# Patient Record
Sex: Female | Born: 1995 | Hispanic: No | Marital: Single | Smoking: Current every day smoker
Health system: Southern US, Community
[De-identification: ages and names within clinical notes are randomized; demographics above are authoritative.]

## PROBLEM LIST (undated history)

## (undated) DIAGNOSIS — J45909 Unspecified asthma, uncomplicated: Secondary | ICD-10-CM

## (undated) HISTORY — PX: CYSTECTOMY: SUR359

## (undated) HISTORY — PX: WISDOM TOOTH EXTRACTION: SHX21

---
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
## 2013-02-15 ENCOUNTER — Encounter (HOSPITAL_COMMUNITY): Payer: Self-pay | Admitting: Emergency Medicine

## 2013-02-15 ENCOUNTER — Emergency Department (HOSPITAL_COMMUNITY)
Admission: EM | Admit: 2013-02-15 | Discharge: 2013-02-15 | Disposition: A | Discharge status: Home / Self Care | Payer: Medicaid Other | Attending: Emergency Medicine | Admitting: Emergency Medicine

## 2013-02-15 DIAGNOSIS — B0089 Other herpesviral infection: Secondary | ICD-10-CM

## 2013-02-15 DIAGNOSIS — Z79899 Other long term (current) drug therapy: Secondary | ICD-10-CM | POA: Insufficient documentation

## 2013-02-15 DIAGNOSIS — B009 Herpesviral infection, unspecified: Secondary | ICD-10-CM | POA: Insufficient documentation

## 2013-02-15 DIAGNOSIS — R599 Enlarged lymph nodes, unspecified: Secondary | ICD-10-CM | POA: Insufficient documentation

## 2013-02-15 DIAGNOSIS — F17200 Nicotine dependence, unspecified, uncomplicated: Secondary | ICD-10-CM | POA: Insufficient documentation

## 2013-02-15 DIAGNOSIS — J45909 Unspecified asthma, uncomplicated: Secondary | ICD-10-CM | POA: Insufficient documentation

## 2013-02-15 MED ORDER — HYDROCODONE-ACETAMINOPHEN 5-325 MG PO TABS: 1.0000 | ORAL_TABLET | Freq: Four times a day (QID) | ORAL | Status: DC | PRN

## 2013-02-15 MED ORDER — VALACYCLOVIR HCL 1 G PO TABS: 1000.0000 mg | ORAL_TABLET | Freq: Three times a day (TID) | ORAL | Status: DC

## 2013-02-15 NOTE — ED Provider Notes (Signed)
This chart was scribed for Arthor Captain PA-C, a non-physician practitioner working with Gilda Crease, * by Lewanda Rife, ED Scribe. This patient was seen in room WTR9/WTR9 and the patient's care was started at 1600.    CSN: [id number]     Arrival date & time 02/15/13  1432 History   First MD Initiated Contact with Patient 02/15/13 1549     Chief Complaint  Patient presents with  . Rash   (Consider location/radiation/quality/duration/timing/severity/associated sxs/prior Treatment) The history is provided by the patient.   HPI Comments: Sonya Walters is a 17 y.o. female who presents to the Emergency Department complaining of constant worsening pruritic rash she attributed tto abrasion from shaving on right groin onset over a week. Reports "nick from shaving" and states rash appeared soon after pt wore  "someone else's harness" which is a prosthetic penis used for intercourse . Reports rash is moderately painful. Reports associated right inguinal tender" lymph node". Reports pain is exacerbated by touch and alleviated by nothing. Denies associated unprotected sex, vaginal discharge, fever, and urinary symptoms. Reports she was prescribed Keflex for rash from a hospital in [place] with no relief of symptoms.   Past Medical History  Diagnosis Date  . Asthma    Past Surgical History  Procedure Laterality Date  . Cystectomy      over right eye  . Wisdom tooth extraction     History reviewed. No pertinent family history. History  Substance Use Topics  . Smoking status: Current Every Day Smoker    Types: Cigarettes  . Smokeless tobacco: Never Used  . Alcohol Use: Yes     Comment: on occasion    Review of Systems  Constitutional: Negative for fever.  Skin: Positive for rash.  Hematological: Positive for adenopathy.  Psychiatric/Behavioral: Negative for confusion.    Allergies  Review of patient's allergies indicates no known allergies.  Home Medications   Current  Outpatient Rx  Name  Route  Sig  Dispense  Refill  . albuterol (PROVENTIL HFA;VENTOLIN HFA) 108 (90 BASE) MCG/ACT inhaler   Inhalation   Inhale 2 puffs into the lungs every 6 (six) hours as needed for wheezing or shortness of breath.          . beclomethasone (QVAR) 40 MCG/ACT inhaler   Inhalation   Inhale 2 puffs into the lungs 2 (two) times daily as needed (for shortness of breath).          Marland Kitchen BIOTIN PO   Oral   Take 1 capsule by mouth daily.         . cephALEXin (KEFLEX) 500 MG capsule   Oral   Take 500 mg by mouth 4 (four) times daily. 10 day course of therapy started 02/08/13.         Marland Kitchen testosterone cypionate (DEPOTESTOTERONE CYPIONATE) 100 MG/ML injection   Intramuscular   Inject 100 mg into the muscle every 14 (fourteen) days. For IM use only          BP 113/66  Pulse 101  Temp(Src) 98.2 F (36.8 C) (Oral)  Resp 18  SpO2 100% Physical Exam  Nursing note and vitals reviewed. Constitutional: He is oriented to person, place, and time. He appears well-developed and well-nourished. No distress.  HENT:  Head: Normocephalic and atraumatic.  Eyes: EOM are normal.  Neck: Neck supple. No tracheal deviation present.  Cardiovascular: Normal rate.   Pulmonary/Chest: Effort normal. No respiratory distress.  Musculoskeletal: Normal range of motion.  Lymphadenopathy:  Right: Inguinal (tender) adenopathy present.  Neurological: He is alert and oriented to person, place, and time.  Skin: Skin is warm and dry. Rash noted. Rash is vesicular.  Tender erythematous, non-draining, vesicular rash on right inguinal region, no secondary infection noted    Psychiatric: He has a normal mood and affect. His behavior is normal.    ED Course  Procedures (including critical care time) Medications - No data to display  Labs Review Labs Reviewed - No data to display Imaging Review No results found.  MDM   1. Herpetic dermatitis    Patient with herpetic eruption of the  thigh. Will d/c with valtrex and pain medication. Nos signs of secondary infection   I personally performed the services described in this documentation, which was scribed in my presence. The recorded information has been reviewed and is accurate.    Arthor Captain, PA-C 02/20/13 2016

## 2013-02-15 NOTE — ED Notes (Signed)
pT presents to the ED with a complaint of a rash that stems from an abrasion.  Pt was seen last week for said and given antibiotics but the rash has persisted and grown.  Rash is red and measures 6cm x 6 cm located on the pt's right anterior hip

## 2013-02-22 NOTE — ED Provider Notes (Signed)
Medical screening examination/treatment/procedure(s) were performed by non-physician practitioner and as supervising physician I was immediately available for consultation/collaboration.    Gilda Crease, MD 02/22/13 1310

## 2013-07-04 ENCOUNTER — Emergency Department (HOSPITAL_COMMUNITY): Payer: Medicaid Other

## 2013-07-04 ENCOUNTER — Emergency Department (HOSPITAL_COMMUNITY)
Admission: EM | Admit: 2013-07-04 | Discharge: 2013-07-04 | Disposition: A | Discharge status: Home / Self Care | Payer: Medicaid Other | Attending: Emergency Medicine | Admitting: Emergency Medicine

## 2013-07-04 DIAGNOSIS — F17200 Nicotine dependence, unspecified, uncomplicated: Secondary | ICD-10-CM | POA: Insufficient documentation

## 2013-07-04 DIAGNOSIS — R197 Diarrhea, unspecified: Secondary | ICD-10-CM

## 2013-07-04 DIAGNOSIS — R42 Dizziness and giddiness: Secondary | ICD-10-CM | POA: Insufficient documentation

## 2013-07-04 DIAGNOSIS — R000 Tachycardia, unspecified: Secondary | ICD-10-CM | POA: Insufficient documentation

## 2013-07-04 DIAGNOSIS — J45909 Unspecified asthma, uncomplicated: Secondary | ICD-10-CM | POA: Insufficient documentation

## 2013-07-04 DIAGNOSIS — Z3202 Encounter for pregnancy test, result negative: Secondary | ICD-10-CM | POA: Insufficient documentation

## 2013-07-04 DIAGNOSIS — R040 Epistaxis: Secondary | ICD-10-CM | POA: Insufficient documentation

## 2013-07-04 DIAGNOSIS — E860 Dehydration: Secondary | ICD-10-CM | POA: Insufficient documentation

## 2013-07-04 DIAGNOSIS — R52 Pain, unspecified: Secondary | ICD-10-CM | POA: Insufficient documentation

## 2013-07-04 DIAGNOSIS — R5381 Other malaise: Secondary | ICD-10-CM | POA: Insufficient documentation

## 2013-07-04 DIAGNOSIS — R61 Generalized hyperhidrosis: Secondary | ICD-10-CM | POA: Insufficient documentation

## 2013-07-04 DIAGNOSIS — R5383 Other fatigue: Secondary | ICD-10-CM

## 2013-07-04 LAB — COMPREHENSIVE METABOLIC PANEL
ALBUMIN: 4.4 g/dL (ref 3.5–5.2)
ALT: 17 U/L (ref 0–35)
AST: 29 U/L (ref 0–37)
Alkaline Phosphatase: 52 U/L (ref 39–117)
BILIRUBIN TOTAL: 0.6 mg/dL (ref 0.3–1.2)
BUN: 23 mg/dL (ref 6–23)
CALCIUM: 9.6 mg/dL (ref 8.4–10.5)
CHLORIDE: 97 meq/L (ref 96–112)
CO2: 23 mEq/L (ref 19–32)
CREATININE: 1.05 mg/dL (ref 0.50–1.10)
GFR calc Af Amer: 88 mL/min — ABNORMAL LOW (ref >90)
GFR calc non Af Amer: 76 mL/min — ABNORMAL LOW (ref >90)
Glucose, Bld: 95 mg/dL (ref 70–99)
Potassium: 3.7 mEq/L (ref 3.7–5.3)
Sodium: 134 mEq/L — ABNORMAL LOW (ref 137–147)
TOTAL PROTEIN: 10.1 g/dL — AB (ref 6.0–8.3)

## 2013-07-04 LAB — URINALYSIS, ROUTINE W REFLEX MICROSCOPIC
Bilirubin Urine: NEGATIVE
Glucose, UA: NEGATIVE mg/dL
KETONES UR: NEGATIVE mg/dL
LEUKOCYTES UA: NEGATIVE
NITRITE: NEGATIVE
PH: 6 (ref 5.0–8.0)
PROTEIN: 100 mg/dL — AB
SPECIFIC GRAVITY, URINE: 1.036 — AB (ref 1.005–1.030)
UROBILINOGEN UA: 0.2 mg/dL (ref 0.0–1.0)

## 2013-07-04 LAB — CBC
HEMATOCRIT: 38.8 % (ref 36.0–46.0)
HEMOGLOBIN: 13.9 g/dL (ref 12.0–15.0)
MCH: 32.5 pg (ref 26.0–34.0)
MCHC: 35.8 g/dL (ref 30.0–36.0)
MCV: 90.7 fL (ref 78.0–100.0)
Platelets: 253 10*3/uL (ref 150–400)
RBC: 4.28 MIL/uL (ref 3.87–5.11)
RDW: 11.6 % (ref 11.5–15.5)
WBC: 4.2 10*3/uL (ref 4.0–10.5)

## 2013-07-04 LAB — URINE MICROSCOPIC-ADD ON

## 2013-07-04 LAB — PREGNANCY, URINE: Preg Test, Ur: NEGATIVE

## 2013-07-04 LAB — LIPASE, BLOOD: LIPASE: 31 U/L (ref 11–59)

## 2013-07-04 MED ORDER — KETOROLAC TROMETHAMINE 30 MG/ML IJ SOLN
30.0000 mg | Freq: Once | INTRAMUSCULAR | Status: AC
  Administered 2013-07-04: 30 mg via INTRAVENOUS
  Filled 2013-07-04: qty 1

## 2013-07-04 MED ORDER — SODIUM CHLORIDE 0.9 % IV BOLUS (SEPSIS)
1000.0000 mL | Freq: Once | INTRAVENOUS | Status: AC
  Administered 2013-07-04: 1000 mL via INTRAVENOUS

## 2013-07-04 MED ORDER — PROMETHAZINE HCL 25 MG RE SUPP: 25.0000 mg | Freq: Four times a day (QID) | RECTAL | Status: DC | PRN

## 2013-07-04 MED ORDER — ONDANSETRON 4 MG PO TBDP: 4.0000 mg | ORAL_TABLET | Freq: Three times a day (TID) | ORAL | Status: DC | PRN

## 2013-07-04 MED ORDER — OXYMETAZOLINE HCL 0.05 % NA SOLN
1.0000 | Freq: Once | NASAL | Status: AC
  Administered 2013-07-04: 1 via NASAL
  Filled 2013-07-04: qty 15

## 2013-07-04 MED ORDER — SODIUM CHLORIDE 0.9 % IV BOLUS (SEPSIS)
500.0000 mL | Freq: Once | INTRAVENOUS | Status: AC
  Administered 2013-07-04: 500 mL via INTRAVENOUS

## 2013-07-04 MED ORDER — ONDANSETRON HCL 4 MG/2ML IJ SOLN
4.0000 mg | Freq: Once | INTRAMUSCULAR | Status: AC
  Administered 2013-07-04: 4 mg via INTRAVENOUS
  Filled 2013-07-04: qty 2

## 2013-07-04 NOTE — ED Provider Notes (Signed)
CSN: [id number]     Arrival date & time 07/04/13  1441 History   First MD Initiated Contact with Patient 07/04/13 1612     Chief Complaint  Patient presents with  . Chills  . Generalized Body Aches  . Diarrhea   (Consider location/radiation/quality/duration/timing/severity/associated sxs/prior Treatment) HPI Comments: Sonya Walters is a 18 year-old female with a past medical history of asthma and undergoing testosterone therapy, presenting the Emergency Department with a chief complaint of subjective fever for 2 days, and diarrhea.  The patient reports 10-11 episodes of non-bloody diarrhea. Denies abdominal pain, nausea or vomiting. The patient reports a productive cough that worsened yesterday. The patient also complains of lightheadedness with changing positions from laying to standing, denies syncope. She also complains of 2 episodes of epistaxis today. Patient's last menstrual period was 07/04/2013.    The history is provided by the patient and medical records. No language interpreter was used.    Past Medical History  Diagnosis Date  . Asthma    Past Surgical History  Procedure Laterality Date  . Cystectomy      over right eye  . Wisdom tooth extraction     No family history on file. History  Substance Use Topics  . Smoking status: Current Every Day Smoker    Types: Cigarettes  . Smokeless tobacco: Never Used  . Alcohol Use: Yes     Comment: on occasion   OB History   Grav Para Term Preterm Abortions TAB SAB Ect Mult Living                 Review of Systems  Constitutional: Positive for chills and fatigue.  HENT: Positive for nosebleeds and rhinorrhea.   Respiratory: Positive for cough.   Genitourinary: Negative for dysuria, urgency, frequency and hematuria.  Neurological: Positive for light-headedness. Negative for syncope and headaches.    Allergies  Review of patient's allergies indicates no known allergies.  Home Medications   Current Outpatient Rx  Name   Route  Sig  Dispense  Refill  . testosterone cypionate (DEPOTESTOTERONE CYPIONATE) 100 MG/ML injection   Intramuscular   Inject 100 mg into the muscle every 14 (fourteen) days. For IM use only         . ondansetron (ZOFRAN ODT) 4 MG disintegrating tablet   Oral   Take 1 tablet (4 mg total) by mouth every 8 (eight) hours as needed for nausea or vomiting.   10 tablet   0   . promethazine (PHENERGAN) 25 MG suppository   Rectal   Place 1 suppository (25 mg total) rectally every 6 (six) hours as needed for nausea or vomiting.   12 each   0    BP 108/69  Pulse 94  Temp(Src) 98.4 F (36.9 C) (Oral)  Resp 18  SpO2 99%  LMP 07/04/2013 Physical Exam  Nursing note and vitals reviewed. Constitutional: She is oriented to person, place, and time. She appears well-developed and well-nourished.  HENT:  Head: Normocephalic and atraumatic.  Nose: Epistaxis is observed.  Left anterior septal lesion. Bleeding controled in ED.   Eyes: EOM are normal. Pupils are equal, round, and reactive to light. No scleral icterus.  Neck: Normal range of motion. Neck supple.  Cardiovascular: Regular rhythm.  Tachycardia present.   Pulmonary/Chest: Effort normal. No respiratory distress. She has no wheezes. She has no rhonchi. She has no rales.  Abdominal: Soft. Bowel sounds are normal. She exhibits no distension. There is tenderness. There is no rebound, no guarding, no  CVA tenderness, no tenderness at McBurney's point and negative Murphy's sign.    Neurological: She is alert and oriented to person, place, and time.  Skin: Skin is warm. She is diaphoretic.    ED Course  Procedures (including critical care time) Labs Review Labs Reviewed  URINALYSIS, ROUTINE W REFLEX MICROSCOPIC - Abnormal; Notable for the following:    Color, Urine AMBER (*)    Specific Gravity, Urine 1.036 (*)    Hgb urine dipstick SMALL (*)    Protein, ur 100 (*)    All other components within normal limits  COMPREHENSIVE  METABOLIC PANEL - Abnormal; Notable for the following:    Sodium 134 (*)    Total Protein 10.1 (*)    GFR calc non Af Amer 76 (*)    GFR calc Af Amer 88 (*)    All other components within normal limits  URINE MICROSCOPIC-ADD ON - Abnormal; Notable for the following:    Bacteria, UA FEW (*)    Casts GRANULAR CAST (*)    All other components within normal limits  URINE CULTURE  CBC  PREGNANCY, URINE  LIPASE, BLOOD   Imaging Review Dg Chest 2 View  07/04/2013   CLINICAL DATA:  Cough, chills and body aches for 2-3 days.  EXAM: CHEST  2 VIEW  COMPARISON:  None.  FINDINGS: Heart size and mediastinal contours are within normal limits. Both lungs are clear. Visualized skeletal structures are unremarkable.  IMPRESSION: Negative exam.   Electronically Signed   By: Drusilla Kanner M.D.   On: 07/04/2013 17:25    EKG Interpretation   None       MDM   1. Diarrhea   2. Dehydration    Pt with diarrhea and generalized body aches, likely viral. Also complains of productive cough with fever will rule out pneumonia.  XR-without acute process. 1806: Re-eval patient reports body aches and nausea have improved, Epistaxis has resolved. Denies episode of dirrhea.  Abdomen non-tender to palpation.  UA-shows high specific gravity and hbg-likely due to menses, granular casts due to dehydration. Re-eval: pt reports symptom improvement.  Abdomen non-tender to palpation.  Discussed lab results, imaging results, and treatment plan with the patient and the patient's mother. Return precautions given. Reports understanding and no other concerns at this time.  Patient is stable for discharge at this time.   Meds given in ED:  Medications  sodium chloride 0.9 % bolus 1,000 mL (0 mLs Intravenous Stopped 07/04/13 1813)  ondansetron (ZOFRAN) injection 4 mg (4 mg Intravenous Given 07/04/13 1733)  oxymetazoline (AFRIN) 0.05 % nasal spray 1 spray (1 spray Each Nare Given 07/04/13 1736)  ketorolac (TORADOL) 30 MG/ML  injection 30 mg (30 mg Intravenous Given 07/04/13 1732)  sodium chloride 0.9 % bolus 500 mL (0 mLs Intravenous Stopped 07/04/13 1857)    Discharge Medication List as of 07/04/2013  7:09 PM    START taking these medications   Details  ondansetron (ZOFRAN ODT) 4 MG disintegrating tablet Take 1 tablet (4 mg total) by mouth every 8 (eight) hours as needed for nausea or vomiting., Starting 07/04/2013, Until Discontinued, Print    promethazine (PHENERGAN) 25 MG suppository Place 1 suppository (25 mg total) rectally every 6 (six) hours as needed for nausea or vomiting., Starting 07/04/2013, Until Discontinued, Print          Clabe Seal, PA-C 07/05/13 2043

## 2013-07-04 NOTE — ED Notes (Signed)
Pt is a [employer] and for the past 3 days has had nausea/ dirrhea, body ache, not feeling well. Has not taken anything for it.

## 2013-07-04 NOTE — Discharge Instructions (Signed)
Call for a follow up appointment with a Family or Primary Care Provider.  °Return if Symptoms worsen.   °Take medication as prescribed.  ° ° °Emergency Department Resource Guide °1) Find a Doctor and Pay Out of Pocket °Although you won't have to find out who is covered by your insurance plan, it is a good idea to ask around and get recommendations. You will then need to call the office and see if the doctor you have chosen will accept you as a new patient and what types of options they offer for patients who are self-pay. Some doctors offer discounts or will set up payment plans for their patients who do not have insurance, but you will need to ask so you aren't surprised when you get to your appointment. ° °2) Contact Your Local Health Department °Not all health departments have doctors that can see patients for sick visits, but many do, so it is worth a call to see if yours does. If you don't know where your local health department is, you can check in your phone book. The CDC also has a tool to help you locate your state's health department, and many state websites also have listings of all of their local health departments. ° °3) Find a Walk-in Clinic °If your illness is not likely to be very severe or complicated, you may want to try a walk in clinic. These are popping up all over the country in pharmacies, drugstores, and shopping centers. They're usually staffed by nurse practitioners or physician assistants that have been trained to treat common illnesses and complaints. They're usually fairly quick and inexpensive. However, if you have serious medical issues or chronic medical problems, these are probably not your best option. ° °No Primary Care Doctor: °- Call [facility] at  832-8000 - they can help you locate a primary care doctor that  accepts your insurance, provides certain services, etc. °- Physician Referral Service- 1-800-533-3463 ° °Chronic Pain Problems: °Organization         Address  Phone    Notes  °[facility]  (336) 297-2271 Patients need to be referred by their primary care doctor.  ° °Medication Assistance: °Organization         Address  Phone   Notes  °[place] [facility] [address] °[place], [place] [address] (336) 641-8030 --Must be a resident of [place] °-- Must have NO insurance coverage whatsoever (no Medicaid/ Medicare, etc.) °-- The pt. MUST have a primary care doctor that directs their care regularly and follows them in the community °  °[facility]  (866) 331-1348   °[employer]  (888) 892-1162   ° °Agencies that provide inexpensive medical care: °Organization         Address  Phone   Notes  °[facility]  (336) 832-8035   °[facility]    (336) 832-7272   °[facility] [address] °[place], [place] [address] (336) 832-4777   °[facility] [address], °[place] (336) 271-4999   °[facility]    (336) 373-0678   °[facility]    (336) 272-1050   °[facility] ° [address], [place] Phone:  (336) 832-4444, Fax:  (336) 832-4440 Hours of Operation:  9 am - 6 pm, M-F.  Also accepts Medicaid/Medicare and self-pay.  °[facility] ° [address], [place]   Phone: (336) 832-3150, Fax: (336) 832-3151. Hours of Operation:  8:30 am - 5:30 pm, M-F.  Also accepts Medicaid and self-pay.  °[facility] [place] [address], [place] Phone: (336) 878-6027   °[facility] [address], [place], [place] (336)723-1848, Ext. 123 Mondays & Thursdays: 7-9 AM.  First 15 patients are seen on a first come, first serve basis. °  ° °Medicaid-accepting [place] Providers: ° °Organization         Address  Phone   Notes  °[facility] [address], [place] (336) 641-2100 Also accepts self-pay patients.  °[facility]  [address], [place] ° (336) 856-9996   °[facility] [address], [place] (336) 288-8857   °[facility] [address] [place] [address], [place] (336) 299-7000   °Veita Bland [address], [place]  ° (336) 373-1557 Only accepts [place] Access Medicaid patients after they have their name applied to their card.  ° °Self-Pay (no insurance) in [place]: ° °Organization         Address  Phone   Notes  °Sickle Cell Patients, [facility] [address], [place] (336) 832-1970   °[facility] [address], [place] (336) 832-4400   °[facility] ° [address], [place] (336) 992-4800   °[facility]/Dr. Osei-Bonsu ° [address] [place] [address], [place] or [address], [place] (336) 841-8500 Phone number for both [place] and [place] locations is the same.  °[facility] [address], [place] (336) 299-0000   °[facility] [address] [place] [address], [place] or [address] (336) 852-7530 °(336) 878-2260   °[facility] [address], [place] (336) 350-1642, phone; (336) 294-5005, fax Sees patients 1st and 3rd Saturday of every month.  Must not qualify for public or private insurance (i.e. Medicaid, Medicare, [place] Health Choice, Veterans' Benefits) • Household income should be no more than 200% of the poverty level •The clinic cannot treat you if you are pregnant or think you are pregnant • Sexually transmitted diseases are not treated at the clinic.  ° ° °Dental Care: °Organization         Address  Phone  Notes  °[facility] [address], [place] (336) 641-6152 Accepts children up to age 21 who are enrolled in Medicaid or [place] Health Choice; pregnant women with a Medicaid card; and children who have  applied for Medicaid or [place] Health Choice, but were declined, whose parents can pay a reduced fee at time of service.  °[facility] [place]  [address], [place] (336) 641-7733 Accepts children up to age 21 who are enrolled in Medicaid or [place] Health Choice; pregnant women with a Medicaid card; and children who have applied for Medicaid or [place] Health Choice, but were declined, whose parents can pay a reduced fee at time of service.  °[facility] ° [address],  (336) 641-4533 Patients are seen by appointment only. Walk-ins are not accepted. [facility] will see patients 18 years of age and older. °Monday - Tuesday (8am-5pm) °Most Wednesdays (8:30-5pm) °$30 per visit, cash only  °[facility] ° [address], [place] (336) 641-4533 Patients are seen by appointment only. Walk-ins are not accepted. [facility] will see patients 18 years of age and older. °One Wednesday Evening (Monthly: Volunteer Based).  $30 per visit, cash only  °[facility]  (919) 537-3737 for adults; Children under age 4, call Graduate Pediatric Dentistry at (919) [phone]. Children aged 4-14, please call (919) 537-3737 to request a pediatric application. ° Dental services are provided in all areas of dental care including fillings, crowns and bridges, complete and partial dentures, implants, gum treatment, root canals, and extractions. Preventive care is also provided. Treatment is provided to both adults and children. °Patients are selected via a lottery and there is often a waiting list. °  °[facility] [address], °[place] ° (336) 763-8833 [email or web address] °  °[facility] [address], [place], [place] (336)723-1848, Ext. 123 Second and Fourth Thursday of each month, opens at 6:30 AM; Clinic ends at 9 AM.  Patients are seen on a first-come first-served basis, and a  limited number are seen during each clinic.  ° °[facility] ° [address], [place], [place] (336) 723-7904   Eligibility Requirements °You must have lived in [place], [place], or [place] counties for at least the last three months. °  You cannot be eligible for state or federal sponsored healthcare insurance, including Veterans Administration, Medicaid, or Medicare. °  You generally cannot be eligible for healthcare insurance through your employer.  °  How to apply: °Eligibility screenings are held every Tuesday and Wednesday afternoon from 1:00 pm until 4:00 pm. You do not need an appointment for the interview!  °[facility] [address], [place], [place] 336-631-2330   °[facility]  336-342-8273   °[facility]  336-703-3100   °[facility]  336-570-6415   ° °Behavioral Health Resources in the Community: °Intensive Outpatient Programs °Organization         Address  Phone  Notes  °[facility] [address], [place], [place] 336-878-6098   °[facility] [address], [place], [place] 336-832-9800   °[facility] [address], [place], [place] ° 336-882-2125   °[facility] [address],  °[place], [place] 1-800-853-5163 or 336-641-4981   °Substance Abuse Resources °Organization         Address  Phone  Notes  °[facility]  336-882-2125   °[facility]  336-784-9470   °[facility]  336-285-9073   °[facility]  336-845-3988   °Residential & Outpatient Substance Abuse Program  1-800-659-3381   °Psychological Services °Organization         Address  Phone  Notes  °[facility]  336- 832-9600   °[facility]  336- 378-7881   °[facility] [address], [place] 1-800-853-5163 or 336-641-4981   ° °Mobile Crisis Teams °Organization          Address  Phone  Notes  °[facility], Mobile Crisis Care Unit  1-877-626-1772   °Assertive °[facility] ° [address] [place], [place] 336-834-9664   °Sharon DeEsch [address] °[place]  336-554-5454   ° °Self-Help/Support Groups °Organization         Address  Phone             Notes  °[facility] - variety of   support groups  336- 373-1402 Call for more information  °Narcotics Anonymous (NA), [facility] [address], °[place]  2 meetings at this location  ° °Residential Treatment Programs °Organization         Address  Phone  Notes  °[facility] [address],    °[place]  1-866-801-8205   °[facility] ° [address], [place], [place] 704-293-8524   °[facility] [address], [place] 336-845-3988 Admissions: 8am-3pm M-F  °[facility] [address]    °[place], [place] 336-841-1104   °[facility] [address], [place], [place] 336-379-7146   °[facility] [address]  °[place], [place] 336-285-9073   °[facility] - Intensive Outpatient [address], [place], [place] 336-852-3033   °[facility] [address]  °[place], [place] 1-877-615-2722 or 336-784-9470   °[facility] [address] [place], [place] 336-227-7417 Accepts Medicaid  °[facility] [address]  °[place] 1-800-659-3381 Substance Abuse/Addiction Treatment  ° °[place] Behavioral Health Resources °Organization         Address  Phone  Notes  °[facility]  (888) 581-9988   °Julie Brannon, PhD [address] [place], [place]   (336) 349-5553 or (336) 951-0000   °[facility]   [address] °[place], [place] (336) 349-4454   °[facility] [address], [place], [place] (336) 342-8316 Insurance/Medicaid/sponsorship  through [facility]  °[facility] [address]                                    [place], [place] (336) 342-8316 Therapy/tele-psych/case  °[facility] [address]  ° [place], [place] (336) 349-2233    °Dr. Arfeen  (336) 349-4544   °[facility] [place]  [employer] [place] [facility] 1) [address],  °2) [address], [place] °3)  [address], [place] (336) 349-3220 °(336) 342-7768 ° °(336) 342-8140   °[place] [facility] (336) 342-1394 or (336) 342-3537 (After Hours)    ° °

## 2013-07-05 LAB — URINE CULTURE
Colony Count: NO GROWTH
Culture: NO GROWTH

## 2013-07-07 NOTE — ED Provider Notes (Signed)
Medical screening examination/treatment/procedure(s) were performed by non-physician practitioner and as supervising physician I was immediately available for consultation/collaboration.  EKG Interpretation   None         Audree CamelScott T Vannie Hilgert, MD 07/07/13 [phone]

## 2014-07-22 ENCOUNTER — Encounter (HOSPITAL_COMMUNITY): Payer: Self-pay | Admitting: Emergency Medicine

## 2014-07-22 ENCOUNTER — Emergency Department (INDEPENDENT_AMBULATORY_CARE_PROVIDER_SITE_OTHER)
Admission: EM | Admit: 2014-07-22 | Discharge: 2014-07-22 | Disposition: A | Discharge status: Home / Self Care | Payer: Medicaid Other | Source: Home / Self Care | Attending: Family Medicine | Admitting: Family Medicine

## 2014-07-22 DIAGNOSIS — S60812A Abrasion of left wrist, initial encounter: Secondary | ICD-10-CM | POA: Diagnosis not present

## 2014-07-22 DIAGNOSIS — Z789 Other specified health status: Secondary | ICD-10-CM | POA: Diagnosis present

## 2014-07-22 DIAGNOSIS — L089 Local infection of the skin and subcutaneous tissue, unspecified: Secondary | ICD-10-CM

## 2014-07-22 DIAGNOSIS — F641 Gender identity disorder in adolescence and adulthood: Secondary | ICD-10-CM

## 2014-07-22 DIAGNOSIS — F640 Transsexualism: Secondary | ICD-10-CM | POA: Diagnosis present

## 2014-07-22 MED ORDER — SULFAMETHOXAZOLE-TRIMETHOPRIM 800-160 MG PO TABS: 2.0000 | ORAL_TABLET | Freq: Two times a day (BID) | ORAL | Status: AC

## 2014-07-22 NOTE — Discharge Instructions (Signed)
Thank you for coming in today. Take Bactrim twice daily for one week. Keep the wound clean. Contact patient records. Call [phone] and asked the operator to transfer you to patient records to get your name changed in the chart. Return as needed   Abrasion An abrasion is a cut or scrape of the skin. Abrasions do not extend through all layers of the skin and most heal within 10 days. It is important to care for your abrasion properly to prevent infection. CAUSES  Most abrasions are caused by falling on, or gliding across, the ground or other surface. When your skin rubs on something, the outer and inner layer of skin rubs off, causing an abrasion. DIAGNOSIS  Your caregiver will be able to diagnose an abrasion during a physical exam.  TREATMENT  Your treatment depends on how large and deep the abrasion is. Generally, your abrasion will be cleaned with water and a mild soap to remove any dirt or debris. An antibiotic ointment may be put over the abrasion to prevent an infection. A bandage (dressing) may be wrapped around the abrasion to keep it from getting dirty.  You may need a tetanus shot if:  You cannot remember when you had your last tetanus shot.  You have never had a tetanus shot.  The injury broke your skin. If you get a tetanus shot, your arm may swell, get red, and feel warm to the touch. This is common and not a problem. If you need a tetanus shot and you choose not to have one, there is a rare chance of getting tetanus. Sickness from tetanus can be serious.  HOME CARE INSTRUCTIONS   If a dressing was applied, change it at least once a day or as directed by your caregiver. If the bandage sticks, soak it off with warm water.   Wash the area with water and a mild soap to remove all the ointment 2 times a day. Rinse off the soap and pat the area dry with a clean towel.   Reapply any ointment as directed by your caregiver. This will help prevent infection and keep the bandage  from sticking. Use gauze over the wound and under the dressing to help keep the bandage from sticking.   Change your dressing right away if it becomes wet or dirty.   Only take over-the-counter or prescription medicines for pain, discomfort, or fever as directed by your caregiver.   Follow up with your caregiver within 24-48 hours for a wound check, or as directed. If you were not given a wound-check appointment, look closely at your abrasion for redness, swelling, or pus. These are signs of infection. SEEK IMMEDIATE MEDICAL CARE IF:   You have increasing pain in the wound.   You have redness, swelling, or tenderness around the wound.   You have pus coming from the wound.   You have a fever or persistent symptoms for more than 2-3 days.  You have a fever and your symptoms suddenly get worse.  You have a bad smell coming from the wound or dressing.  MAKE SURE YOU:   Understand these instructions.  Will watch your condition.  Will get help right away if you are not doing well or get worse. Document Released: 03/18/2005 Document Revised: 05/25/2012 Document Reviewed: 05/12/2011 [facility] Patient Information 2015 [place] [place]. This information is not intended to replace advice given to you by your health care provider. Make sure you discuss any questions you have with your health care provider.

## 2014-07-22 NOTE — ED Notes (Signed)
Pt states that they cut their hand last night.

## 2014-07-22 NOTE — ED Provider Notes (Signed)
Sonya MainlandKayla Walters "Vincenza HewsShane" is a 19 y.o. 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 He was skateboarding yesterday when he fell landing on his outstretched hands. He suffered an abrasion to the left palm. Over the last 24 hours the abrasion has become more inflamed. No fevers or chills nausea vomiting or diarrhea.    Past Medical History  Diagnosis Date  . Asthma    Past Surgical History  Procedure Laterality Date  . Cystectomy      over right eye  . Wisdom tooth extraction     History  Substance Use Topics  . Smoking status: Current Every Day Smoker    Types: Cigarettes  . Smokeless tobacco: Never Used  . Alcohol Use: Yes     Comment: on occasion   ROS as above Medications: No current facility-administered medications for this encounter.   Current Outpatient Prescriptions  Medication Sig Dispense Refill  . sulfamethoxazole-trimethoprim (SEPTRA DS) 800-160 MG per tablet Take 2 tablets by mouth 2 (two) times daily. 28 tablet 0  . testosterone cypionate (DEPOTESTOTERONE CYPIONATE) 100 MG/ML injection Inject 100 mg into the muscle every 14 (fourteen) days. For IM use only    . [DISCONTINUED] promethazine (PHENERGAN) 25 MG suppository Place 1 suppository (25 mg total) rectally every 6 (six) hours as needed for nausea or vomiting. 12 each 0   No Known Allergies   Exam:  BP 138/99 mmHg  Pulse 63  Temp(Src) 97.8 F (36.6 C) (Oral)  Resp 16  SpO2 100% Gen: Well NAD Skin: 1 small blister in one abrasion with mild surrounding erythema and tenderness left palm. Wrist is nontender normal motion capillary fill and strength.  No results found for this or any previous visit (from the past 24 hour(s)). No results found.  Assessment and Plan: 19 y.o. transgendered female with left palm abrasion with slight infection. Treat with oral Bactrim. Return as  needed.  Discussed warning signs or symptoms. Please see discharge instructions. Patient expresses understanding.     Rodolph BongEvan S Corey, MD 07/22/14 [phone]

## 2015-01-14 IMAGING — CR DG CHEST 2V
2 series · 2 of 2 positions shown · non-contrast
Comparison: None.

CLINICAL DATA: Cough, chills and body aches for 2-3 days.

EXAM:
CHEST  2 VIEW

[w chest pa]
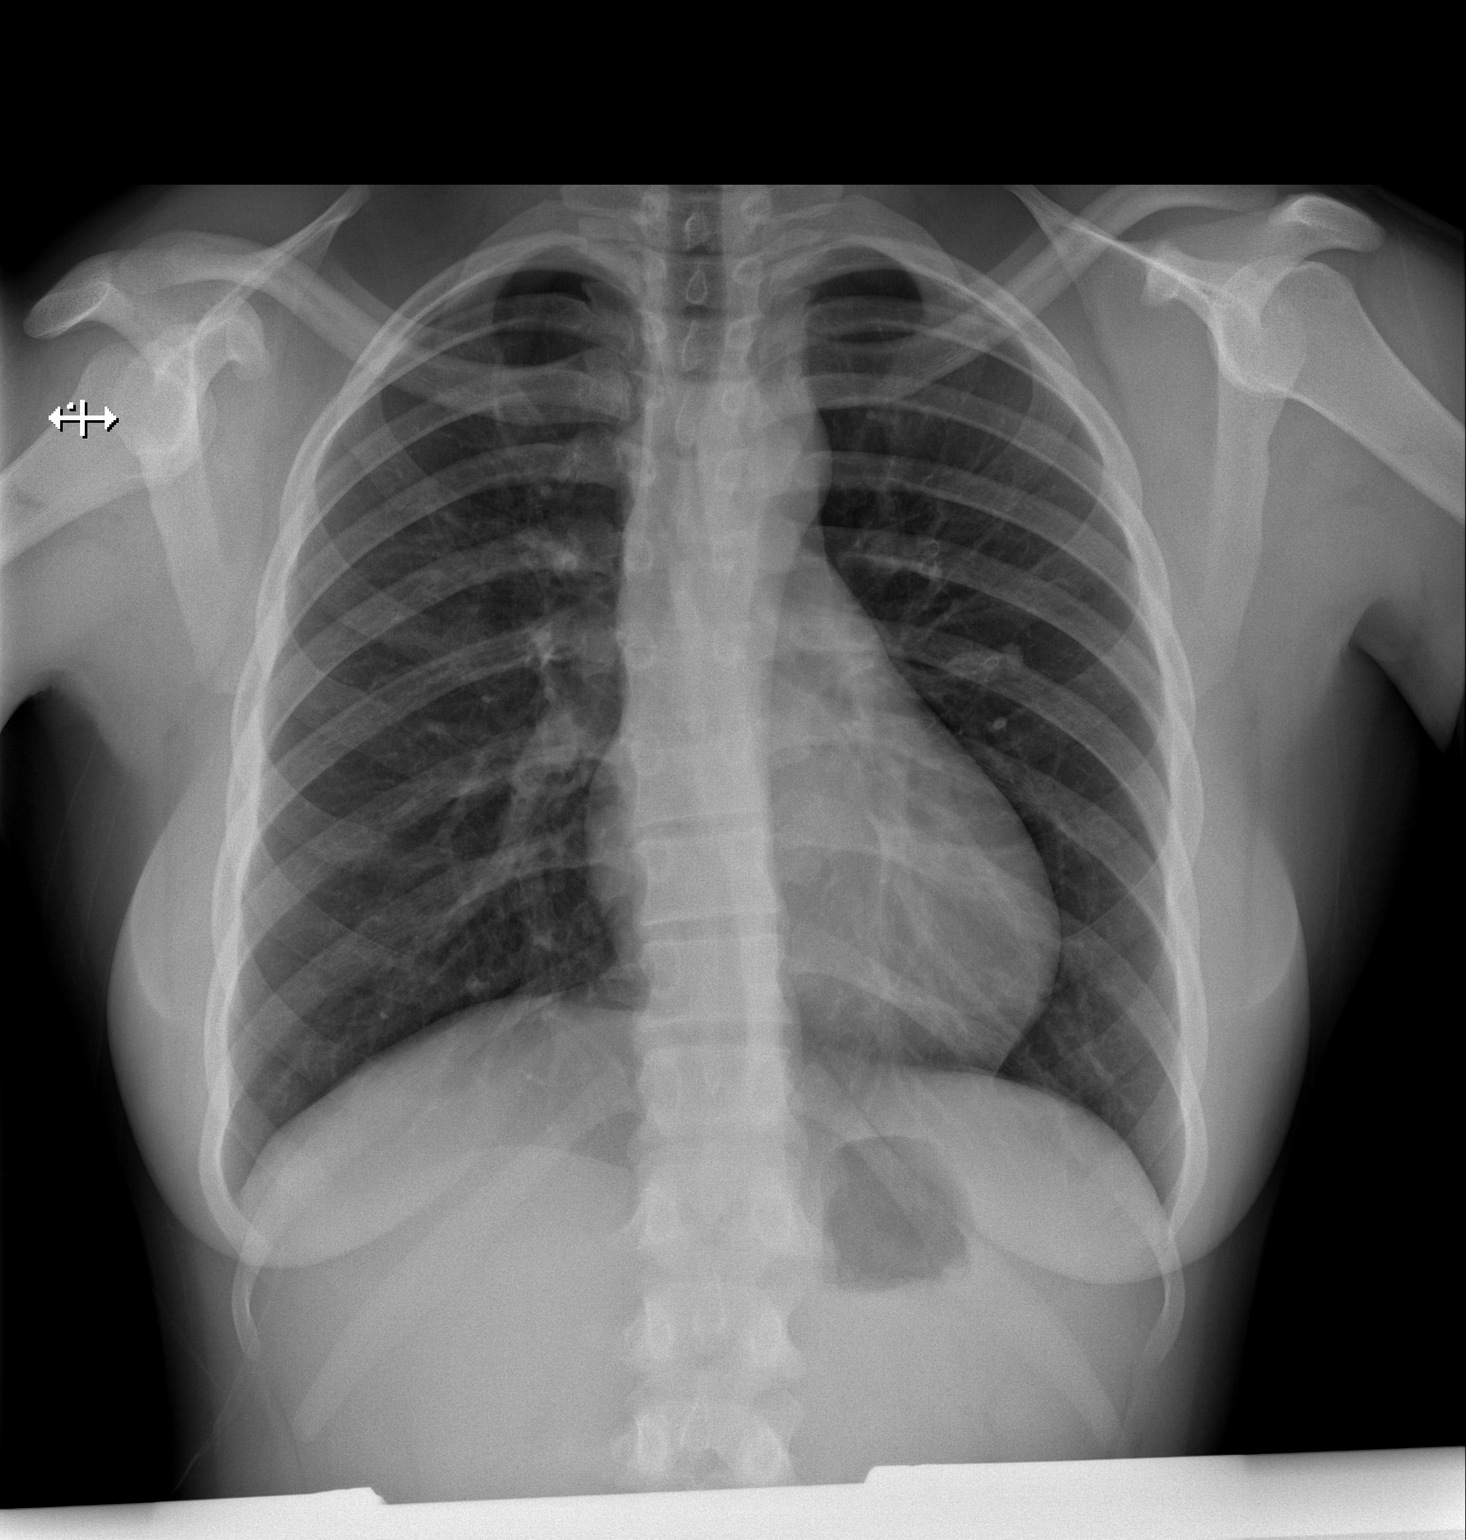

[w chest lat]
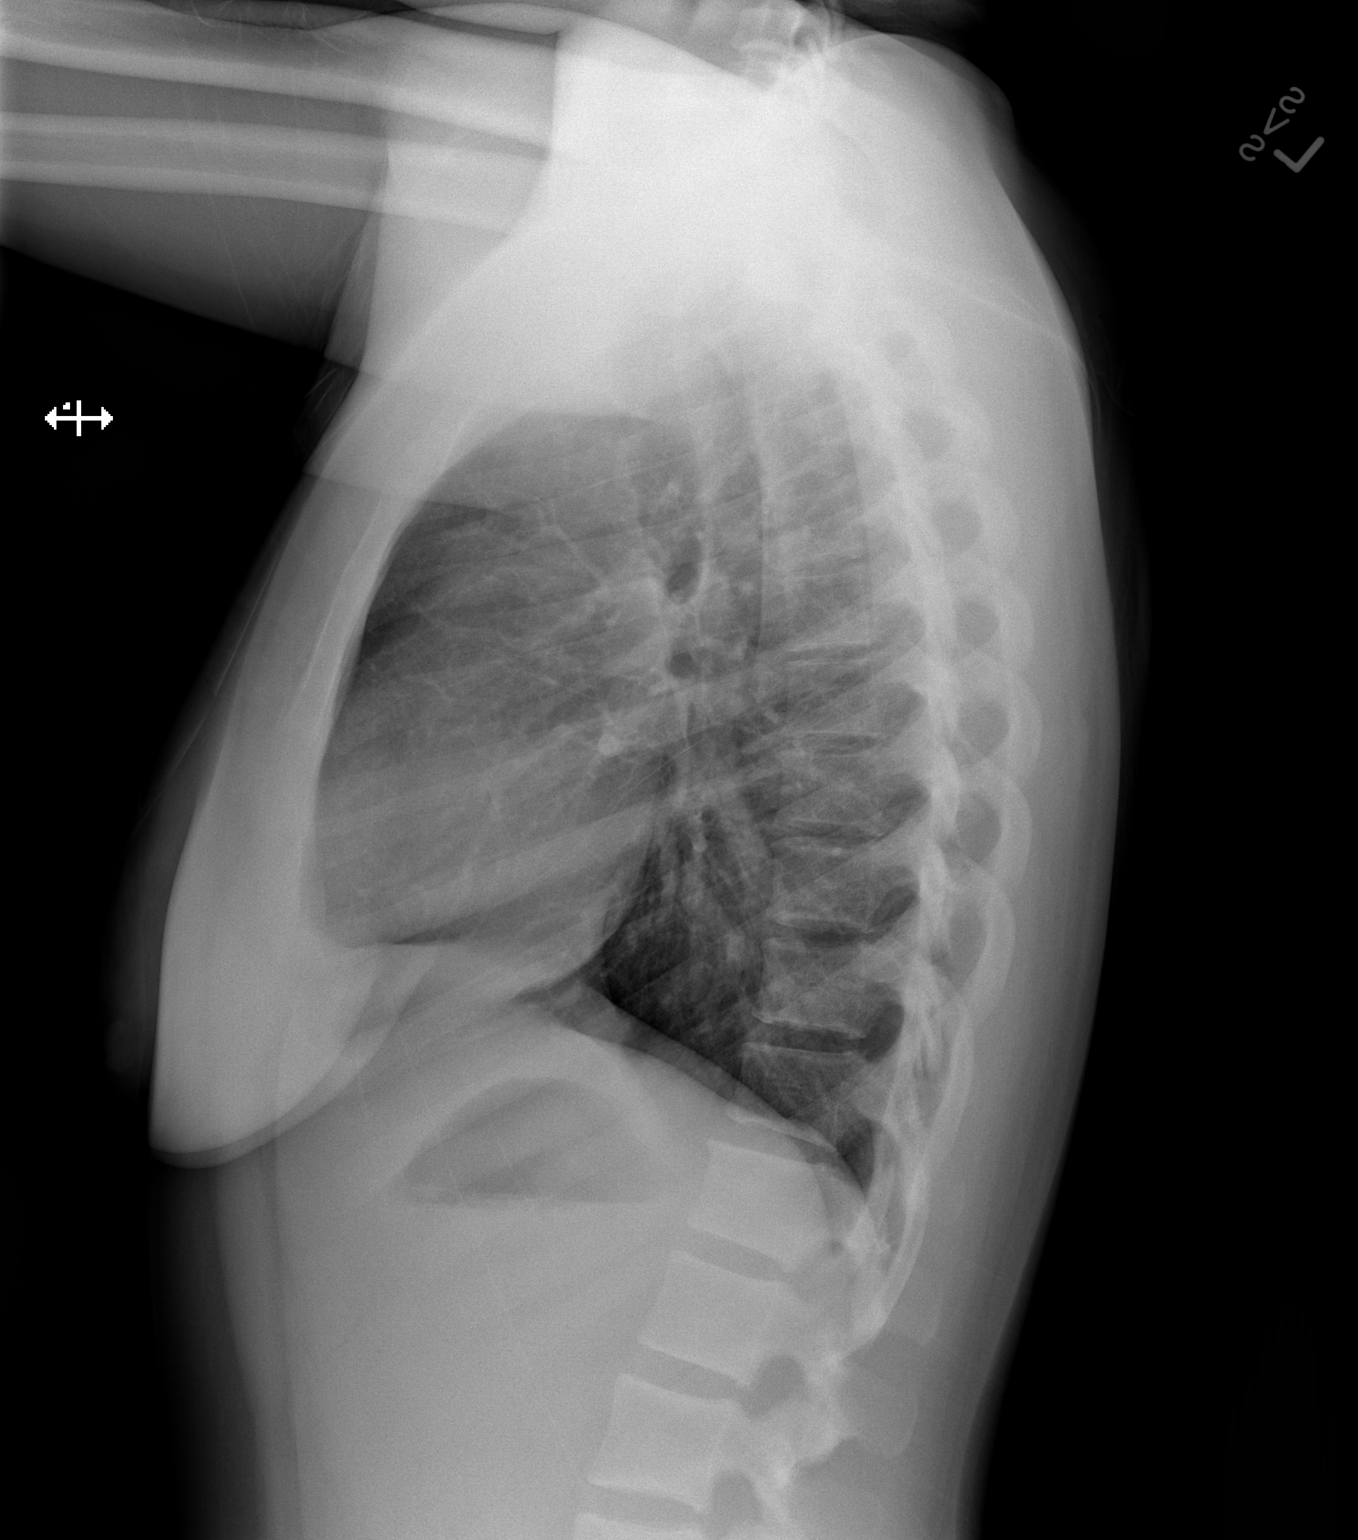

[2 of 2 positions shown; findings below may reference images not displayed]

FINDINGS: Heart size and mediastinal contours are within normal limits. Both
lungs are clear. Visualized skeletal structures are unremarkable.
IMPRESSION: Negative exam.

## 2016-06-22 DEATH — deceased

## 2017-07-25 ENCOUNTER — Encounter (HOSPITAL_COMMUNITY): Payer: Self-pay | Admitting: Emergency Medicine

## 2017-07-25 ENCOUNTER — Ambulatory Visit (HOSPITAL_COMMUNITY)
Admission: EM | Admit: 2017-07-25 | Discharge: 2017-07-25 | Disposition: A | Discharge status: Other Acute Inpatient Hospital | Payer: Self-pay | Attending: Family Medicine | Admitting: Family Medicine

## 2017-07-25 NOTE — ED Triage Notes (Signed)
PT C/O: cold sx onset last night associated w/fevers, sweating, dry cough, light headed  Reports abortion this past friday  TAKING MEDS: Ibuprofen   A&O x4... NAD... Ambulatory
